# Patient Record
Sex: Male | Born: 1975 | Race: Asian | Hispanic: No | Marital: Single | Smoking: Former smoker
Health system: Southern US, Community
[De-identification: ages and names within clinical notes are randomized; demographics above are authoritative.]

## PROBLEM LIST (undated history)

## (undated) DIAGNOSIS — A159 Respiratory tuberculosis unspecified: Secondary | ICD-10-CM

---

## 2011-08-05 ENCOUNTER — Other Ambulatory Visit: Payer: Self-pay | Admitting: Infectious Diseases

## 2011-08-05 ENCOUNTER — Ambulatory Visit
Admission: RE | Admit: 2011-08-05 | Discharge: 2011-08-05 | Disposition: A | Discharge status: Home / Self Care | Payer: No Typology Code available for payment source | Source: Ambulatory Visit | Attending: Infectious Diseases | Admitting: Infectious Diseases

## 2011-08-05 DIAGNOSIS — R7611 Nonspecific reaction to tuberculin skin test without active tuberculosis: Secondary | ICD-10-CM

## 2011-12-05 ENCOUNTER — Encounter (HOSPITAL_COMMUNITY): Payer: Self-pay | Admitting: *Deleted

## 2011-12-05 ENCOUNTER — Emergency Department (HOSPITAL_COMMUNITY)
Admission: EM | Admit: 2011-12-05 | Discharge: 2011-12-05 | Disposition: A | Discharge status: Home / Self Care | Payer: BC Managed Care – PPO | Attending: Emergency Medicine | Admitting: Emergency Medicine

## 2011-12-05 DIAGNOSIS — Z79899 Other long term (current) drug therapy: Secondary | ICD-10-CM | POA: Insufficient documentation

## 2011-12-05 DIAGNOSIS — F17200 Nicotine dependence, unspecified, uncomplicated: Secondary | ICD-10-CM | POA: Insufficient documentation

## 2011-12-05 DIAGNOSIS — IMO0002 Reserved for concepts with insufficient information to code with codable children: Secondary | ICD-10-CM

## 2011-12-05 DIAGNOSIS — R55 Syncope and collapse: Secondary | ICD-10-CM

## 2011-12-05 DIAGNOSIS — R42 Dizziness and giddiness: Secondary | ICD-10-CM | POA: Insufficient documentation

## 2011-12-05 DIAGNOSIS — W1809XA Striking against other object with subsequent fall, initial encounter: Secondary | ICD-10-CM | POA: Insufficient documentation

## 2011-12-05 DIAGNOSIS — S0180XA Unspecified open wound of other part of head, initial encounter: Secondary | ICD-10-CM | POA: Insufficient documentation

## 2011-12-05 HISTORY — DX: Respiratory tuberculosis unspecified: A15.9

## 2011-12-05 MED ORDER — LIDOCAINE-EPINEPHRINE-TETRACAINE (LET) SOLUTION
3.0000 mL | Freq: Once | NASAL | Status: AC
  Administered 2011-12-05: 3 mL via TOPICAL
  Filled 2011-12-05: qty 3

## 2011-12-05 MED ORDER — TETANUS-DIPHTH-ACELL PERTUSSIS 5-2.5-18.5 LF-MCG/0.5 IM SUSP
0.5000 mL | Freq: Once | INTRAMUSCULAR | Status: AC
  Administered 2011-12-05: 0.5 mL via INTRAMUSCULAR
  Filled 2011-12-05: qty 0.5

## 2011-12-05 NOTE — ED Notes (Signed)
The pt fell earl;ier tonight coming [place].  Lac lt forehead.

## 2011-12-05 NOTE — ED Provider Notes (Signed)
History     CSN: [id number]  Arrival date & time 12/05/11  0216   First MD Initiated Contact with Patient 12/05/11 0239      Chief Complaint  Patient presents with  . Head Laceration     Patient is a 36 y.o. male presenting with scalp laceration. The history is provided by the patient. A language interpreter was used.  Head Laceration This is a new problem. The current episode started 1 to 2 hours ago. The problem occurs constantly. The problem has not changed since onset.Pertinent negatives include no chest pain, no abdominal pain, no headaches and no shortness of breath. The symptoms are aggravated by nothing. The symptoms are relieved by nothing.  pt reports he was walking to bathroom, felt dizzy/lightheaded and fell and hit his head and has laceration to his head. No LOC No HA No vomiting No neck pain No cp/sob No weakness reported He feels improved at this time He has h/o latent TB, has been managed by health department and taking meds for this He denies h/o syncope He denies ETOH use He has no other complaints  Past Medical History  Diagnosis Date  . Tuberculosis     History reviewed. No pertinent past surgical history.  History reviewed. No pertinent family history.  History  Substance Use Topics  . Smoking status: Current Everyday Smoker  . Smokeless tobacco: Not on file  . Alcohol Use: Yes      Review of Systems  Respiratory: Negative for shortness of breath.   Cardiovascular: Negative for chest pain.  Gastrointestinal: Negative for abdominal pain.  Neurological: Negative for headaches.    Allergies  Review of patient's allergies indicates no known allergies.  Home Medications   Current Outpatient Rx  Name Route Sig Dispense Refill  . RIFAMPIN 300 MG PO CAPS Oral Take 600 mg by mouth daily.      BP 139/87  Pulse 86  Temp(Src) 98.3 F (36.8 C) (Oral)  Resp 18  SpO2 98%  Physical Exam CONSTITUTIONAL: Well developed/well nourished HEAD  AND FACE: 2cm laceration to left forehead, bleeding controlled EYES: EOMI/PERRL ENMT: Mucous membranes moist NECK: supple no meningeal signs SPINE:entire spine nontender CV: S1/S2 noted, no murmurs/rubs/gallops noted LUNGS: Lungs are clear to auscultation bilaterally, no apparent distress ABDOMEN: soft, nontender, no rebound or guarding GU:no cva tenderness NEURO: Pt is awake/alert, moves all extremitiesx4, gait normal EXTREMITIES: pulses normal, full ROM SKIN: warm, color normal PSYCH: no abnormalities of mood noted  ED Course  Procedures   LACERATION REPAIR Performed by: Joya Gaskins Consent: Verbal consent obtained. Risks and benefits: risks, benefits and alternatives were discussed Patient identity confirmed: provided demographic data Time out performed prior to procedure Prepped and Draped in normal sterile fashion Wound explored  Laceration Location: left forehead  Laceration Length: 2cm  No Foreign Bodies seen or palpated  Anesthesia: local infiltration  Local anesthetic: lidocaine 1% w/ epinephrine  Anesthetic total: 3 ml  Irrigation method: syringe Amount of cleaning: standard  Skin closure: simple  Number of sutures or staples: 3 absorbable  Technique: simple interrupted  Patient tolerance: Patient tolerated the procedure well with no immediate complications.   Pt reports feeling lightheaded after standing to go to bathroom, no LOC, no headache, no vomiting, will defer imaging for now Low risk for lethal cause of  syncope Will repair laceration   MDM  Nursing notes reviewed and considered in documentation Phone interpreter used       Date: 12/05/2011  Rate: 97  Rhythm:  normal sinus rhythm  QRS Axis: normal  Intervals: normal  ST/T Wave abnormalities: normal  Conduction Disutrbances:none  Narrative Interpretation:   Old EKG Reviewed: none available    Joya Gaskins, MD 12/05/11 [phone]

## 2011-12-05 NOTE — ED Notes (Signed)
MD at the bedside  

## 2015-03-11 ENCOUNTER — Emergency Department (HOSPITAL_COMMUNITY): Payer: BLUE CROSS/BLUE SHIELD

## 2015-03-11 ENCOUNTER — Emergency Department (HOSPITAL_COMMUNITY)
Admission: EM | Admit: 2015-03-11 | Discharge: 2015-03-11 | Disposition: A | Discharge status: Home / Self Care | Payer: BLUE CROSS/BLUE SHIELD | Attending: Emergency Medicine | Admitting: Emergency Medicine

## 2015-03-11 ENCOUNTER — Encounter (HOSPITAL_COMMUNITY): Payer: Self-pay | Admitting: General Practice

## 2015-03-11 DIAGNOSIS — R0602 Shortness of breath: Secondary | ICD-10-CM | POA: Diagnosis not present

## 2015-03-11 DIAGNOSIS — Z8619 Personal history of other infectious and parasitic diseases: Secondary | ICD-10-CM | POA: Diagnosis not present

## 2015-03-11 DIAGNOSIS — Z720 Tobacco use: Secondary | ICD-10-CM | POA: Insufficient documentation

## 2015-03-11 DIAGNOSIS — K567 Ileus, unspecified: Secondary | ICD-10-CM | POA: Diagnosis not present

## 2015-03-11 DIAGNOSIS — R059 Cough, unspecified: Secondary | ICD-10-CM

## 2015-03-11 DIAGNOSIS — R509 Fever, unspecified: Secondary | ICD-10-CM | POA: Insufficient documentation

## 2015-03-11 DIAGNOSIS — R079 Chest pain, unspecified: Secondary | ICD-10-CM | POA: Diagnosis not present

## 2015-03-11 DIAGNOSIS — R05 Cough: Secondary | ICD-10-CM | POA: Insufficient documentation

## 2015-03-11 LAB — CBC
HCT: 41.4 % (ref 39.0–52.0)
Hemoglobin: 14.2 g/dL (ref 13.0–17.0)
MCH: 32.1 pg (ref 26.0–34.0)
MCHC: 34.3 g/dL (ref 30.0–36.0)
MCV: 93.7 fL (ref 78.0–100.0)
Platelets: 131 10*3/uL — ABNORMAL LOW (ref 150–400)
RBC: 4.42 MIL/uL (ref 4.22–5.81)
RDW: 12.5 % (ref 11.5–15.5)
WBC: 9.8 10*3/uL (ref 4.0–10.5)

## 2015-03-11 LAB — COMPREHENSIVE METABOLIC PANEL
ALBUMIN: 3.7 g/dL (ref 3.5–5.0)
ALK PHOS: 43 U/L (ref 38–126)
ALT: 16 U/L — AB (ref 17–63)
AST: 27 U/L (ref 15–41)
Anion gap: 9 (ref 5–15)
BUN: 8 mg/dL (ref 6–20)
CALCIUM: 9.2 mg/dL (ref 8.9–10.3)
CO2: 24 mmol/L (ref 22–32)
CREATININE: 0.66 mg/dL (ref 0.61–1.24)
Chloride: 103 mmol/L (ref 101–111)
GFR calc Af Amer: >60 mL/min (ref >60)
Glucose, Bld: 98 mg/dL (ref 65–99)
POTASSIUM: 3.7 mmol/L (ref 3.5–5.1)
SODIUM: 136 mmol/L (ref 135–145)
Total Bilirubin: 0.9 mg/dL (ref 0.3–1.2)
Total Protein: 7.6 g/dL (ref 6.5–8.1)

## 2015-03-11 LAB — I-STAT TROPONIN, ED: Troponin i, poc: 0 ng/mL (ref 0.00–0.08)

## 2015-03-11 LAB — D-DIMER, QUANTITATIVE: D-Dimer, Quant: 1.15 ug/mL-FEU — ABNORMAL HIGH (ref 0.00–0.48)

## 2015-03-11 LAB — LIPASE, BLOOD: Lipase: 24 U/L (ref 22–51)

## 2015-03-11 MED ORDER — DEXTROMETHORPHAN HBR 15 MG/5ML PO SYRP: 10.0000 mL | ORAL_SOLUTION | Freq: Four times a day (QID) | ORAL | Status: AC | PRN

## 2015-03-11 MED ORDER — TRAMADOL HCL 50 MG PO TABS: 50.0000 mg | ORAL_TABLET | Freq: Four times a day (QID) | ORAL | Status: AC | PRN

## 2015-03-11 MED ORDER — IOHEXOL 350 MG/ML SOLN
80.0000 mL | Freq: Once | INTRAVENOUS | Status: AC | PRN
  Administered 2015-03-11: 60 mL via INTRAVENOUS

## 2015-03-11 NOTE — ED Provider Notes (Signed)
CSN: [id number]     Arrival date & time 03/11/15  1057 History   First MD Initiated Contact with Patient 03/11/15 1102     Chief Complaint  Patient presents with  . Cough     (Consider location/radiation/quality/duration/timing/severity/associated sxs/prior Treatment) HPI Comments: 39 year old male presenting with cough 3 days. Cough is nonproductive but he feels as if he needs to cough up mucus. Admits to associated shortness of breath and chest pain with coughing only along with epigastric abdominal pain with coughing only. Reports having a fever 3 days ago which has since subsided. No alleviating factors tried. Has a decreased appetite. No weight loss. Denies nausea, vomiting, diarrhea. No recent travel.  Patient is a 39 y.o. male presenting with cough. The history is provided by the patient. The history is limited by a language barrier. A language interpreter was used.  Cough Associated symptoms: chest pain (with coughing only), fever and shortness of breath     Past Medical History  Diagnosis Date  . Tuberculosis    History reviewed. No pertinent past surgical history. No family history on file. History  Substance Use Topics  . Smoking status: Current Every Day Smoker -- 0.25 packs/day    Types: Cigarettes  . Smokeless tobacco: Not on file  . Alcohol Use: Yes    Review of Systems  Constitutional: Positive for fever.  Respiratory: Positive for cough and shortness of breath.   Cardiovascular: Positive for chest pain (with coughing only).  Gastrointestinal: Positive for abdominal pain (with coughing).  All other systems reviewed and are negative.     Allergies  Review of patient's allergies indicates no known allergies.  Home Medications   Prior to Admission medications   Medication Sig Start Date End Date Taking? Authorizing Provider  dextromethorphan 15 MG/5ML syrup Take 10 mLs (30 mg total) by mouth 4 (four) times daily as needed for cough. 03/11/15   Kathrynn Speed,  PA-C  traMADol (ULTRAM) 50 MG tablet Take 1 tablet (50 mg total) by mouth every 6 (six) hours as needed. 03/11/15   Jos Cygan M Layn Kye, PA-C   BP 129/77 mmHg  Pulse 88  Temp(Src) 97.5 F (36.4 C) (Oral)  Resp 17  Ht  (1.575 m)  Wt 120 lb (54.432 kg)  BMI 21.94 kg/m2  SpO2 98% Physical Exam  Constitutional: He is oriented to person, place, and time. He appears well-developed and well-nourished. No distress.  HENT:  Head: Normocephalic and atraumatic.  Mouth/Throat: Oropharynx is clear and moist.  Eyes: Conjunctivae and EOM are normal. Pupils are equal, round, and reactive to light.  Neck: Normal range of motion. Neck supple. No JVD present.  Cardiovascular: Normal rate, regular rhythm, normal heart sounds and intact distal pulses.   No extremity edema.  Pulmonary/Chest: Effort normal and breath sounds normal. No respiratory distress.  Dry cough present.  Abdominal: Normal appearance and bowel sounds are normal. He exhibits no distension. There is tenderness in the epigastric area. There is no rigidity, no rebound and no guarding.  No peritoneal signs.  Musculoskeletal: Normal range of motion. He exhibits no edema.  Neurological: He is alert and oriented to person, place, and time. He has normal strength. No sensory deficit.  Speech fluent, goal oriented. Moves extremities without ataxia. Equal grip strength bilateral.  Skin: Skin is warm and dry. He is not diaphoretic.  Psychiatric: He has a normal mood and affect. His behavior is normal.  Nursing note and vitals reviewed.   ED Course  Procedures (including critical  care time) Labs Review Labs Reviewed  CBC - Abnormal; Notable for the following:    Platelets 131 (*)    All other components within normal limits  COMPREHENSIVE METABOLIC PANEL - Abnormal; Notable for the following:    ALT 16 (*)    All other components within normal limits  D-DIMER, QUANTITATIVE (NOT AT [facility]) - Abnormal; Notable for the following:    D-Dimer,  Quant 1.15 (*)    All other components within normal limits  LIPASE, BLOOD  I-STAT TROPOININ, ED    Imaging Review Dg Chest 2 View  03/11/2015   CLINICAL DATA:  Cough.  EXAM: CHEST  2 VIEW  COMPARISON:  None.  FINDINGS: Mediastinum hilar structures normal. Lungs are clear. Heart size normal. No pleural effusion or pneumothorax. Dilated loops of small large bowel are noted. This is most consistent adynamic ileus.  IMPRESSION: 1.  No acute cardiopulmonary disease.  2.  Adynamic ileus.   Electronically Signed   By: Maisie Fus  Register   On: 03/11/2015 12:35   Ct Angio Chest Pe W/cm &/or Wo Cm  03/11/2015   CLINICAL DATA:  Cough  EXAM: CT ANGIOGRAPHY CHEST WITH CONTRAST  TECHNIQUE: Multidetector CT imaging of the chest was performed using the standard protocol during bolus administration of intravenous contrast. Multiplanar CT image reconstructions and MIPs were obtained to evaluate the vascular anatomy.  CONTRAST:  60mL OMNIPAQUE IOHEXOL 350 MG/ML SOLN  COMPARISON:  Chest x-ray 03/11/2015  FINDINGS: Negative for pulmonary embolism.  Pulmonary arteries normal in size.  Negative for aortic aneurysm or dissection. Mild cardiac enlargement. Negative for pericardial effusion. Negative for coronary calcification.  Lungs are clear without infiltrate or effusion.  Negative for mass or adenopathy.  No pleural effusion.  Negative upper abdomen.  No acute skeletal lesion.  Review of the MIP images confirms the above findings.  IMPRESSION: Negative   Electronically Signed   By: Marlan Palau M.D.   On: 03/11/2015 16:10     EKG Interpretation   Date/Time:  Tuesday March 11 2015 11:19:27 EDT Ventricular Rate:  93 PR Interval:  139 QRS Duration: 88 QT Interval:  341 QTC Calculation: 424 R Axis:   31 Text Interpretation:  Sinus rhythm No prior for comparison Confirmed by  Gwendolyn Grant  MD, BLAIR ([id number]) on 03/11/2015 11:43:45 AM      MDM   Final diagnoses:  Cough  Shortness of breath  Ileus   Non-toxic  appearing, NAD. AFVSS. O2 sat 100% on RA. Pain with coughing only. Initial thought of CAP with reported fever, CXR negative for any acute cardiopulmonary finding. Adynamic ileus noted. Abdomen is soft with mild epigastric pain, no peritoneal signs. No vomiting. Normal BM. Given tachy on arrival with cough and SOB, d-dimer obtained and elevated at 1.15. CT angio pending. Plan d/c home if normal.  CT angina negative. Patient in no apparent distress. Vitals remain stable. No further tachycardia. Abdomen soft with minimal tenderness. Will discharge home with dextromethorphan and tramadol. Resources given for PCP follow-up. Stable for discharge. Return precautions given. Patient states understanding of treatment care plan and is agreeable.  Kathrynn Speed, PA-C 03/11/15 1623  Elwin Mocha, MD 03/11/15 [phone]

## 2015-03-11 NOTE — ED Notes (Signed)
Interpreter phone used to go over discharge paperwork.

## 2015-03-11 NOTE — ED Notes (Signed)
Pt complaining of abdominal pain and chest pain when coughing from 2-3 days. Pt reporting a nonproductive cough. Pt denies N/V/D. Pt describes abdominal pain as burning. Pt also reporting a loss of appetite. Pt denies fever or chills.

## 2015-03-11 NOTE — ED Notes (Signed)
Per verbal orders Celene Skeen, PA.. EKG completed

## 2015-03-11 NOTE — Discharge Instructions (Signed)
Take dextromethorphan as directed as needed for cough. Take tramadol as directed as needed for pain. Follow up with the wellness clinic or one of the resources below to establish care with a primary care physician.  Cough, Adult  A cough is a reflex that helps clear your throat and airways. It can help heal the body or may be a reaction to an irritated airway. A cough may only last 2 or 3 weeks (acute) or may last more than 8 weeks (chronic).  CAUSES Acute cough:  Viral or bacterial infections. Chronic cough:  Infections.  Allergies.  Asthma.  Post-nasal drip.  Smoking.  Heartburn or acid reflux.  Some medicines.  Chronic lung problems (COPD).  Cancer. SYMPTOMS   Cough.  Fever.  Chest pain.  Increased breathing rate.  High-pitched whistling sound when breathing (wheezing).  Colored mucus that you cough up (sputum). TREATMENT   A bacterial cough may be treated with antibiotic medicine.  A viral cough must run its course and will not respond to antibiotics.  Your caregiver may recommend other treatments if you have a chronic cough. HOME CARE INSTRUCTIONS   Only take over-the-counter or prescription medicines for pain, discomfort, or fever as directed by your caregiver. Use cough suppressants only as directed by your caregiver.  Use a cold steam vaporizer or humidifier in your bedroom or home to help loosen secretions.  Sleep in a semi-upright position if your cough is worse at night.  Rest as needed.  Stop smoking if you smoke. SEEK IMMEDIATE MEDICAL CARE IF:   You have pus in your sputum.  Your cough starts to worsen.  You cannot control your cough with suppressants and are losing sleep.  You begin coughing up blood.  You have difficulty breathing.  You develop pain which is getting worse or is uncontrolled with medicine.  You have a fever. MAKE SURE YOU:   Understand these instructions.  Will watch your condition.  Will get help right  away if you are not doing well or get worse. Document Released: 01/22/2011 Document Revised: 10/18/2011 Document Reviewed: 01/22/2011 [facility] Patient Information 2015 [place], [place]. This information is not intended to replace advice given to you by your health care provider. Make sure you discuss any questions you have with your health care provider.  Ileus The intestine (bowel, or gut) is a long, muscular tube connecting your stomach to your rectum. If the intestine stops working, food cannot pass through. This is called an ileus. This can happen for a variety of reasons. Ileus is a major medical problem that usually requires hospitalization. If your intestine stops working because of a blockage, this is called a bowel obstruction and is a different condition. CAUSES   Surgery in your abdomen. This can last from a few hours to a few days.  An infection or inflammation in the belly (abdomen). This includes inflammation of the lining of the abdomen (peritonitis).  Infection or inflammation in other parts of the body, such as pneumonia or pancreatitis.  Passage of gallstones or kidney stones.  Damage to the nerves or blood vessels which go to the bowel.  Imbalance in the salts in the blood (electrolytes).  Injury to the brain and/or spinal cord.  Medications. Many medications can cause ileus or make it worse. The most common of these are strong pain medications. SYMPTOMS  Symptoms of bowel obstruction come from the bowel inactivity. They may include:  Bloating. Your belly gets bigger (distension).  Pain or discomfort in the abdomen.  Poor appetite, feeling sick to your stomach (nausea), and vomiting.  You may also not be able to hear your normal bowel sounds, such as "growling" in your stomach. DIAGNOSIS   Your history and a physical exam will usually suggest to your caregiver that you have an ileus.  X-rays or a CT scan of your abdomen will confirm the diagnosis. X-rays, CT  scans, and lab tests may also suggest the cause. TREATMENT   Rest the intestine until it starts working again. This is most often accomplished by:  Stopping intake of oral food and drink. Dehydration is prevented by using IV (intravenous) fluids.  Sometimes, a nasogastric tube (NG tube) is needed. This is a narrow plastic tube inserted through your nose and into your stomach. It is connected to suction to keep the stomach emptied out. This also helps treat the nausea and vomiting.  If there is an imbalance in the electrolytes, they are corrected with supplements in your intravenous fluids.  Medications that might make an ileus worse might be stopped.  There are no medications that reliably treat ileus, though your caregiver may suggest a trial of certain medications.  If your condition is slow to resolve, you will be reevaluated to be sure another condition, such as a blockage, is not present. Ileus is common and usually has a good outcome. Depending on the cause of your ileus, it usually can be treated by your caregivers with good results. Sometimes, specialists (surgeons or gastroenterologists) are asked to assist in your care.  HOME CARE INSTRUCTIONS   Follow your caregiver's instructions regarding diet and fluid intake. This will usually include drinking plenty of clear fluids, avoiding alcohol and caffeine, and eating a gentle diet.  Follow your caregiver's instructions regarding activity. A period of rest is sometimes advised before returning to work or school.  Take only medications prescribed by your caregiver. Be especially careful with narcotic pain medication, which can slow your bowel activity and contribute to ileus.  Keep any follow-up appointments with your caregiver or specialists. SEEK MEDICAL CARE IF:   You have a recurrence of nausea, vomiting, or abdominal discomfort.  You develop fever of more than 102 F (38.9 C). SEEK IMMEDIATE MEDICAL CARE IF:   You have  severe abdominal pain.  You are unable to keep fluids down. Document Released: 07/29/2003 Document Revised: 12/10/2013 Document Reviewed: 11/28/2008 [facility] Patient Information 2015 [place], [place]. This information is not intended to replace advice given to you by your health care provider. Make sure you discuss any questions you have with your health care provider. RESOURCE GUIDE  Chronic Pain Problems: Contact [facility]  [phone] Patients need to be referred by their primary care doctor.  Insufficient Money for Medicine: Contact [employer]:  call "211."   No Primary Care Doctor: - Call [facility]  [phone] - can help you locate a primary care doctor that  accepts your insurance, provides certain services, etc. - Physician Referral [phone]  Agencies that provide inexpensive medical care: - [facility]  [phone] - [facility]  [phone] - [facility]  [phone] - [facility]  [phone] - [facility]  [phone] [facility]  [phone]  Medicaid-accepting [facility] Providers: - [facility]- [address]  [phone], Mon-Fri 9am-7pm, Sat 9am-1pm - [facility]- [address] [place], [address] [place]  [phone] - [facility]- [address]  [phone] [facility]- [address]  [phone] - Renaye Rakers- [address] [place], [address], [phone]  Only accepts [place] patients after they have their name  applied to their card  Self Pay (no insurance) in [facility]: - Sickle Cell Patients: Dr Willey Blade, [facility]  [address] [place], [phone] - [facility]- [address] [place]  [phone]       [facility] [place] [facility]- [address]       -     [facility]- see information above (Speak  to [employer] if you do not have insurance)       -  [facility]- [address],  [phone]       -  [facility]- [address], [phone]       -  Dr Julio Sicks-  [address], [place], [phone]       -  [facility] - [address], [phone]       -  [facility]- [address] [phone], also [address] [phone]       -    [facility]- [address] [place], [phone], 1st & 3rd Saturday        every month, 10am-1pm  1) Find a Doctor and Pay Out of Pocket Although you won't have to find out who is covered by your insurance plan, it is a good idea to ask around and get recommendations. You will then need to call the office and see if the doctor you have chosen will accept you as a new patient and what types of options they offer for patients who are self-pay. Some doctors offer discounts or will set up payment plans for their patients who do not have insurance, but you will need to ask so you aren't surprised when you get to your appointment.  2) Contact Your Local Health Department Not all health departments have doctors that can see patients for sick visits, but many do, so it is worth a call to see if yours does. If you don't know where your local health department is, you can check in your phone book. The CDC also has a tool to help you locate your state's health department, and many state websites also have listings of all of their local health departments.  3) Find a Walk-in Clinic If your illness is not likely to be very severe or complicated, you may want to try a walk in clinic. These are popping up all over the country in pharmacies, drugstores, and shopping centers. They're usually staffed by nurse practitioners or physician assistants that have been trained to treat common illnesses and complaints. They're usually fairly quick and inexpensive. However, if you have serious medical  issues or chronic medical problems, these are probably not your best option

## 2015-10-26 ENCOUNTER — Encounter (HOSPITAL_COMMUNITY): Payer: Self-pay | Admitting: Emergency Medicine

## 2015-10-26 ENCOUNTER — Emergency Department (HOSPITAL_COMMUNITY)
Admission: EM | Admit: 2015-10-26 | Discharge: 2015-10-26 | Disposition: A | Discharge status: Home / Self Care | Payer: BLUE CROSS/BLUE SHIELD | Attending: Emergency Medicine | Admitting: Emergency Medicine

## 2015-10-26 DIAGNOSIS — J3489 Other specified disorders of nose and nasal sinuses: Secondary | ICD-10-CM

## 2015-10-26 DIAGNOSIS — R0981 Nasal congestion: Secondary | ICD-10-CM | POA: Diagnosis present

## 2015-10-26 DIAGNOSIS — Z87891 Personal history of nicotine dependence: Secondary | ICD-10-CM | POA: Insufficient documentation

## 2015-10-26 DIAGNOSIS — Z8611 Personal history of tuberculosis: Secondary | ICD-10-CM | POA: Diagnosis not present

## 2015-10-26 MED ORDER — FLUTICASONE PROPIONATE 50 MCG/ACT NA SUSP: 2.0000 | Freq: Every day | NASAL | Status: AC

## 2015-10-26 NOTE — ED Notes (Signed)
C/o runny nose today.  Denies any other symptoms.

## 2015-10-26 NOTE — Discharge Instructions (Signed)
You may have allergies.  Follow up with a physician.

## 2015-10-26 NOTE — ED Notes (Signed)
See EDP assessment 

## 2015-10-26 NOTE — ED Provider Notes (Signed)
CSN: [id number]     Arrival date & time 10/26/15  2015 History  By signing my name below, I, Doreatha Martin, attest that this documentation has been prepared under the direction and in the presence of  [employer], PA-C. Electronically Signed: Doreatha Martin, ED Scribe. 10/26/2015. 9:34 PM.    Chief Complaint  Patient presents with  . Nasal Congestion   The history is provided by the patient and the spouse. No language interpreter was used.   HPI Comments: Evan Reeves is a 40 y.o. male who presents to the Emergency Department complaining of mild rhinorrhea onset today. Pt denies taking OTC medications at home to improve symptoms. No h/o seasonal allergies. No known environmental or food allergies. He denies fever, chills, cough.    Past Medical History  Diagnosis Date  . Tuberculosis    History reviewed. No pertinent past surgical history. No family history on file. Social History  Substance Use Topics  . Smoking status: Former Smoker -- 0.25 packs/day  . Smokeless tobacco: None  . Alcohol Use: No    Review of Systems  Constitutional: Negative for fever and chills.  HENT: Positive for rhinorrhea.   Respiratory: Negative for cough.    Allergies  Review of patient's allergies indicates no known allergies.  Home Medications   Prior to Admission medications   Medication Sig Start Date End Date Taking? Authorizing Provider  dextromethorphan 15 MG/5ML syrup Take 10 mLs (30 mg total) by mouth 4 (four) times daily as needed for cough. 03/11/15   Kathrynn Speed, PA-C  fluticasone (FLONASE) 50 MCG/ACT nasal spray Place 2 sprays into both nostrils daily. 10/26/15   Lakera Viall Patel-Mills, PA-C  traMADol (ULTRAM) 50 MG tablet Take 1 tablet (50 mg total) by mouth every 6 (six) hours as needed. 03/11/15   Robyn M Hess, PA-C   BP 146/98 mmHg  Pulse 131  Temp(Src) 98.8 F (37.1 C) (Oral)  Resp 18  SpO2 97% Physical Exam  Constitutional: He is oriented to person, place, and time. He appears  well-developed and well-nourished. No distress.  HENT:  Head: Normocephalic and atraumatic.  Eyes: Conjunctivae and EOM are normal. Pupils are equal, round, and reactive to light.  Neck: Normal range of motion. Neck supple.  Cardiovascular: Normal rate.   Pulmonary/Chest: Effort normal. No respiratory distress.  Musculoskeletal: Normal range of motion.  Neurological: He is alert and oriented to person, place, and time.  Skin: Skin is warm and dry.  Psychiatric: He has a normal mood and affect. His behavior is normal.  Nursing note and vitals reviewed.   ED Course  Procedures (including critical care time) DIAGNOSTIC STUDIES: Oxygen Saturation is 97% on RA, normal by my interpretation.    COORDINATION OF CARE: 9:33 PM Discussed treatment plan with pt at bedside which includes symptomatic therapy and pt agreed to plan.    MDM   Final diagnoses:  Rhinorrhea   Meds given in ED:  Medications - No data to display  Discharge Medication List as of 10/26/2015  9:39 PM    START taking these medications   Details  fluticasone (FLONASE) 50 MCG/ACT nasal spray Place 2 sprays into both nostrils daily., Starting 10/26/2015, Until Discontinued, Print        BP 146/98 mmHg  Pulse 131  Temp(Src) 98.8 F (37.1 C) (Oral)  Resp 18  SpO2 97%   Pt presents with rhinorrhea for one day. No known environmental or food allergies. Pt will be discharged with Rx for flonase. Discussed return  precautions. Patient asked for 2 days off from work.  I explained that I could not do that for rhinorrhea without any other symptoms.  He was given a work note that stated he was here at 9pm today and could return tomorrow. Pt is hemodynamically stable & in NAD prior to discharge. Pt is agreeable to plan.   I personally performed the services described in this documentation, which was scribed in my presence. The recorded information has been reviewed and is accurate.   Catha GosselinHanna Patel-Mills, PA-C 10/26/15  [id number]  Linwood DibblesJon Knapp, MD 10/26/15 2250

## 2016-05-22 IMAGING — CT CT ANGIO CHEST
2 of 8 series · 19 of 46 positions shown · IV contrast (OMNI)
Comparison: Chest x-ray 03/11/2015

CLINICAL DATA: Cough

EXAM:
CT ANGIOGRAPHY CHEST WITH CONTRAST
TECHNIQUE: Multidetector CT imaging of the chest was performed using the
standard protocol during bolus administration of intravenous
contrast. Multiplanar CT image reconstructions and MIPs were
obtained to evaluate the vascular anatomy.
CONTRAST:  60mL OMNIPAQUE IOHEXOL 350 MG/ML SOLN

[Series 5: thins · axial · 0.60mm/px · z∈[+1240,+1503]mm · 16 of 291 slices shown]
[im 14/291  lung]
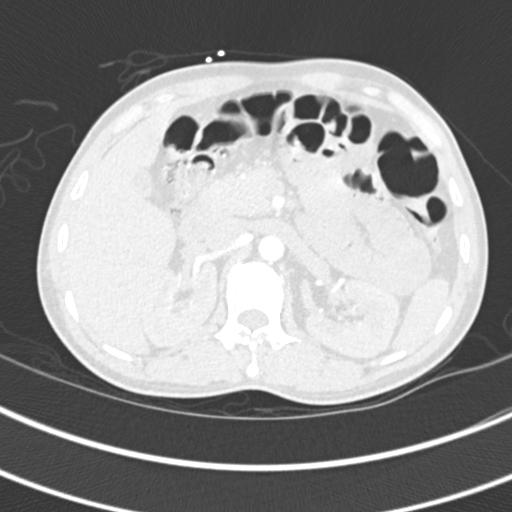
[im 27/291  soft-tissue]
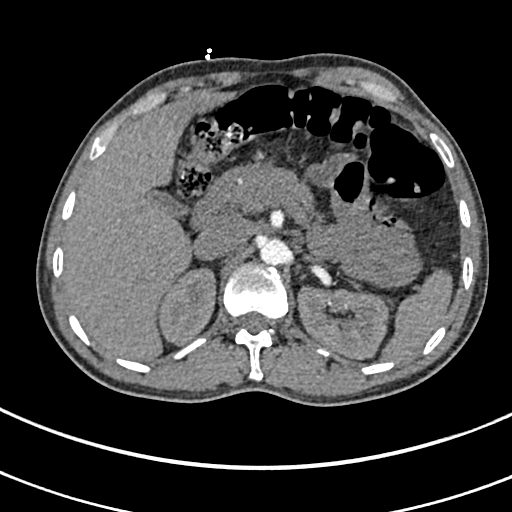
[im 53/291  lung]
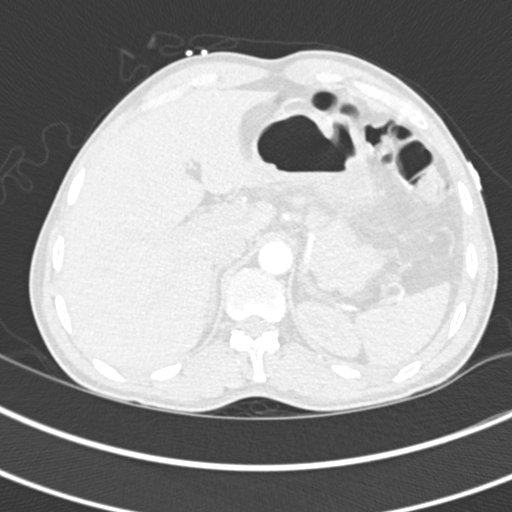
[im 66/291  soft-tissue]
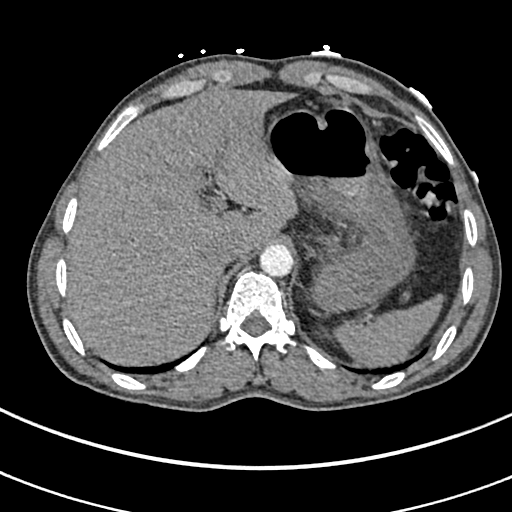
[im 80/291  lung]
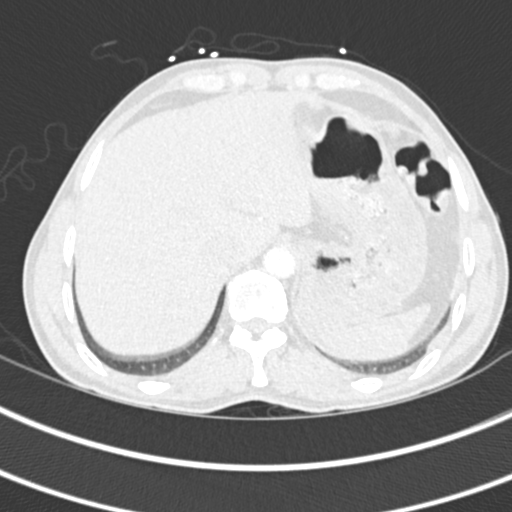
[im 106/291  soft-tissue]
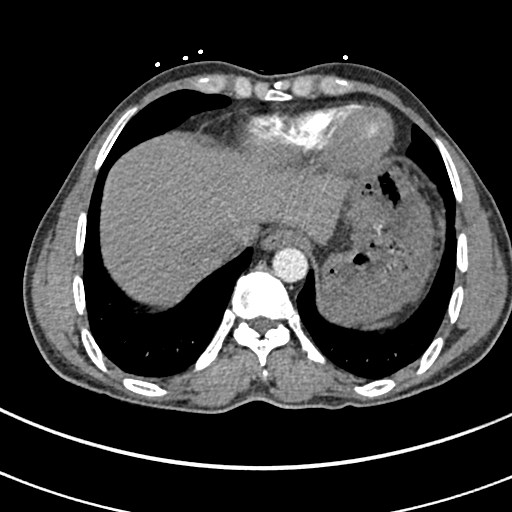
[im 119/291  lung]
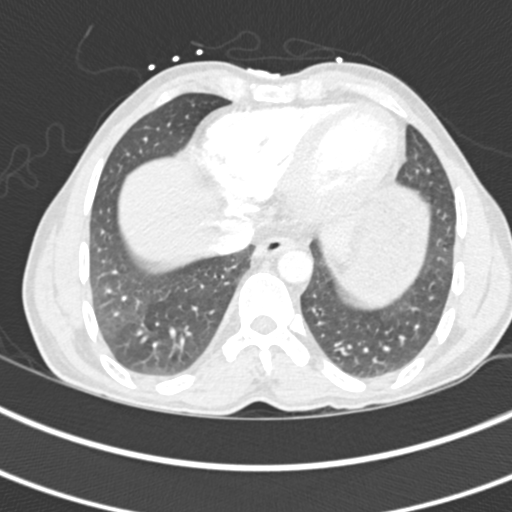
[im 132/291  soft-tissue]
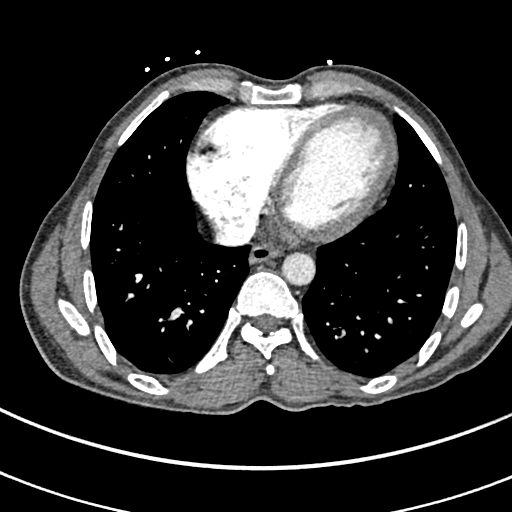
[im 159/291  lung]
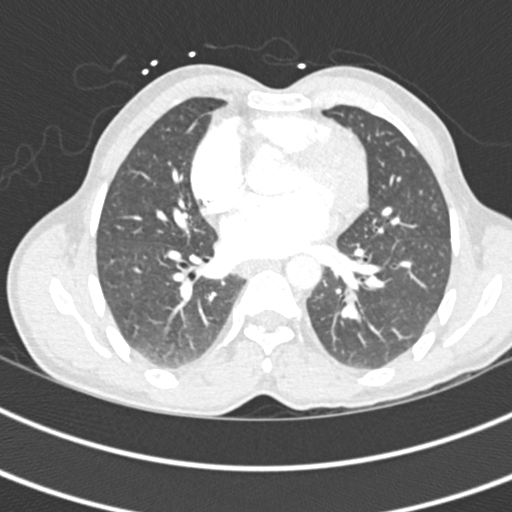
[im 172/291  soft-tissue]
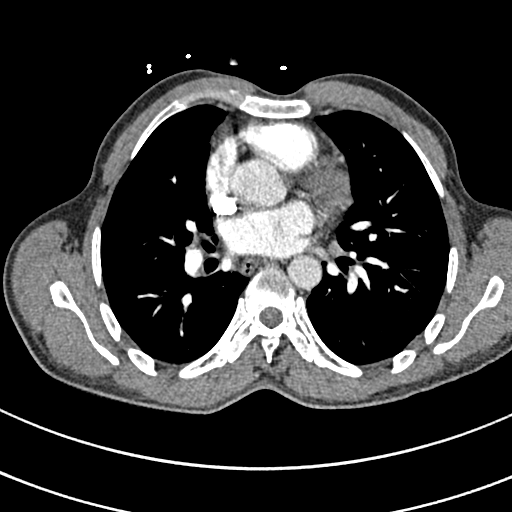
[im 185/291  lung]
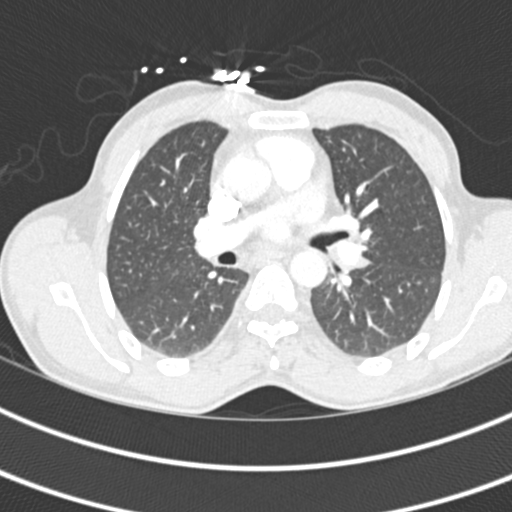
[im 211/291  soft-tissue]
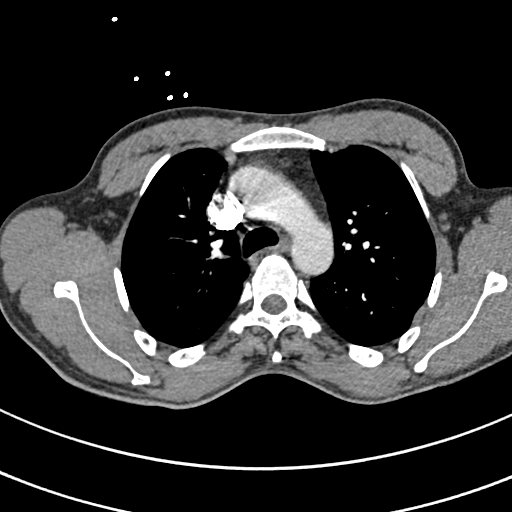
[im 225/291  lung]
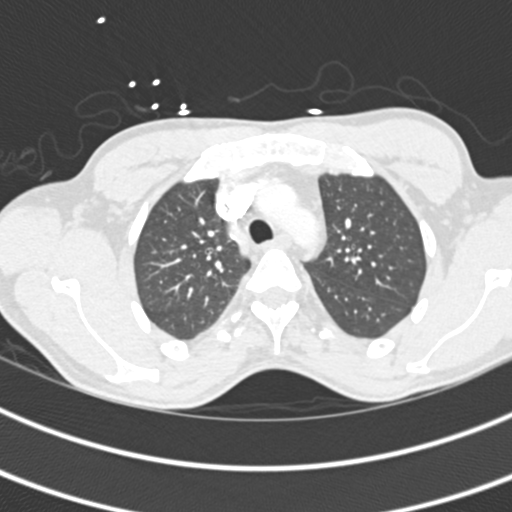
[im 238/291  soft-tissue]
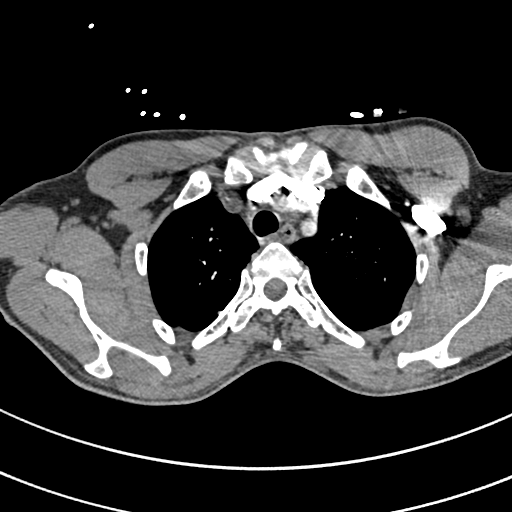
[im 264/291  lung]
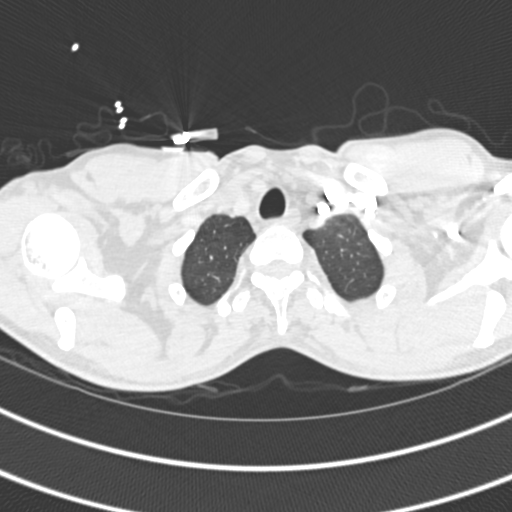
[im 277/291  soft-tissue]
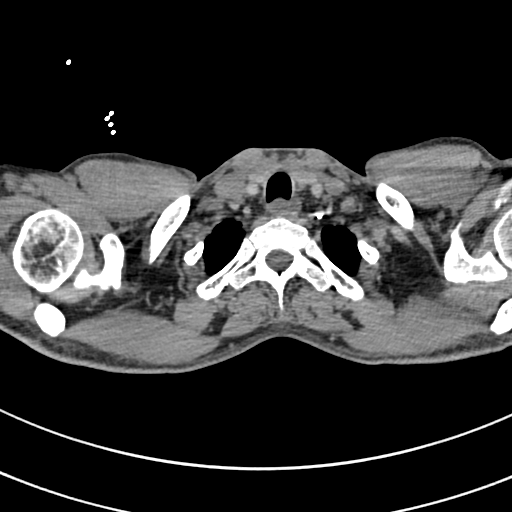

[Series 7: coronal mpr · coronal · 0.59mm/px · 3 of 109 slices shown]
[im 28/109  soft-tissue]
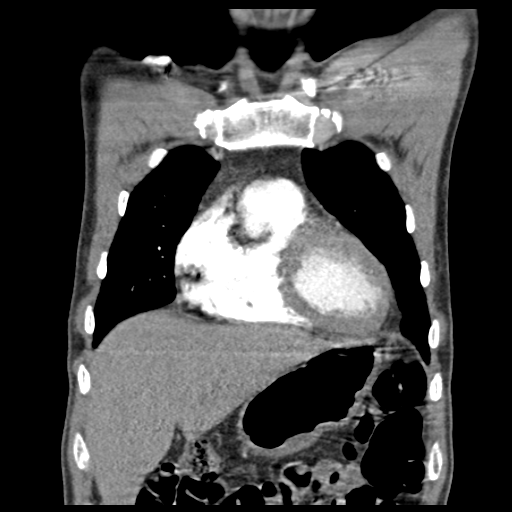
[im 55/109  soft-tissue]
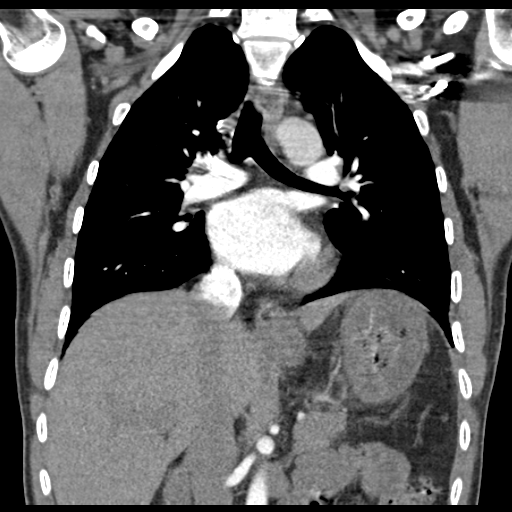
[im 82/109  soft-tissue]
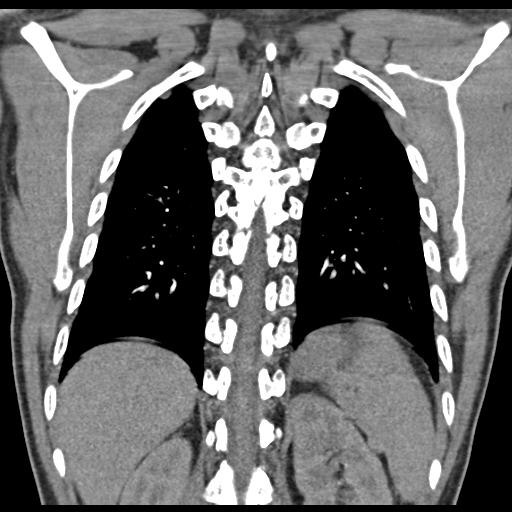

[19 of 46 positions shown; findings below may reference images not displayed]

FINDINGS: Negative for pulmonary embolism.  Pulmonary arteries normal in size.

Negative for aortic aneurysm or dissection. Mild cardiac
enlargement. Negative for pericardial effusion. Negative for
coronary calcification.

Lungs are clear without infiltrate or effusion.

Negative for mass or adenopathy.  No pleural effusion.

Negative upper abdomen.  No acute skeletal lesion.

Review of the MIP images confirms the above findings.
IMPRESSION: Negative

## 2016-05-22 IMAGING — CR DG CHEST 2V
2 series · 2 of 2 positions shown · non-contrast
Comparison: None.

CLINICAL DATA: Cough.

EXAM:
CHEST  2 VIEW

[chest pa]
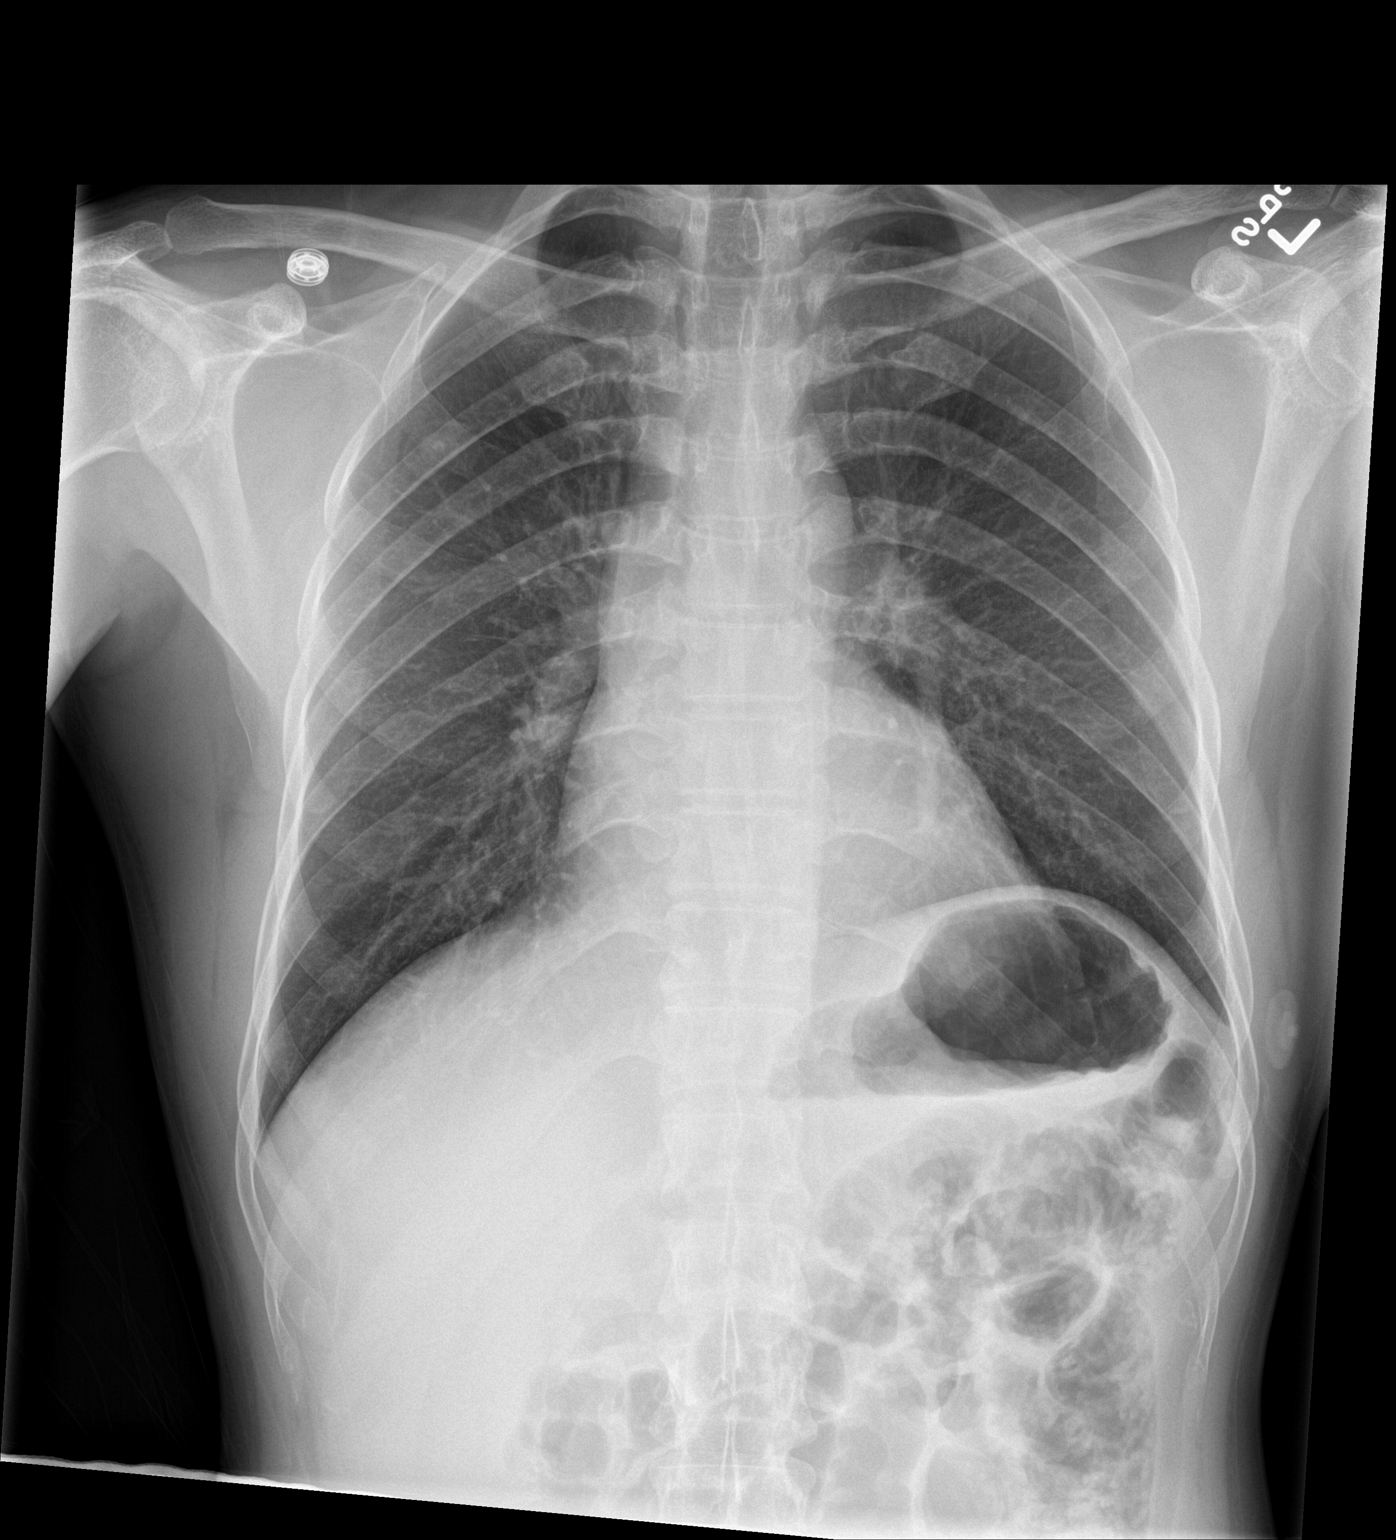

[chest lat]
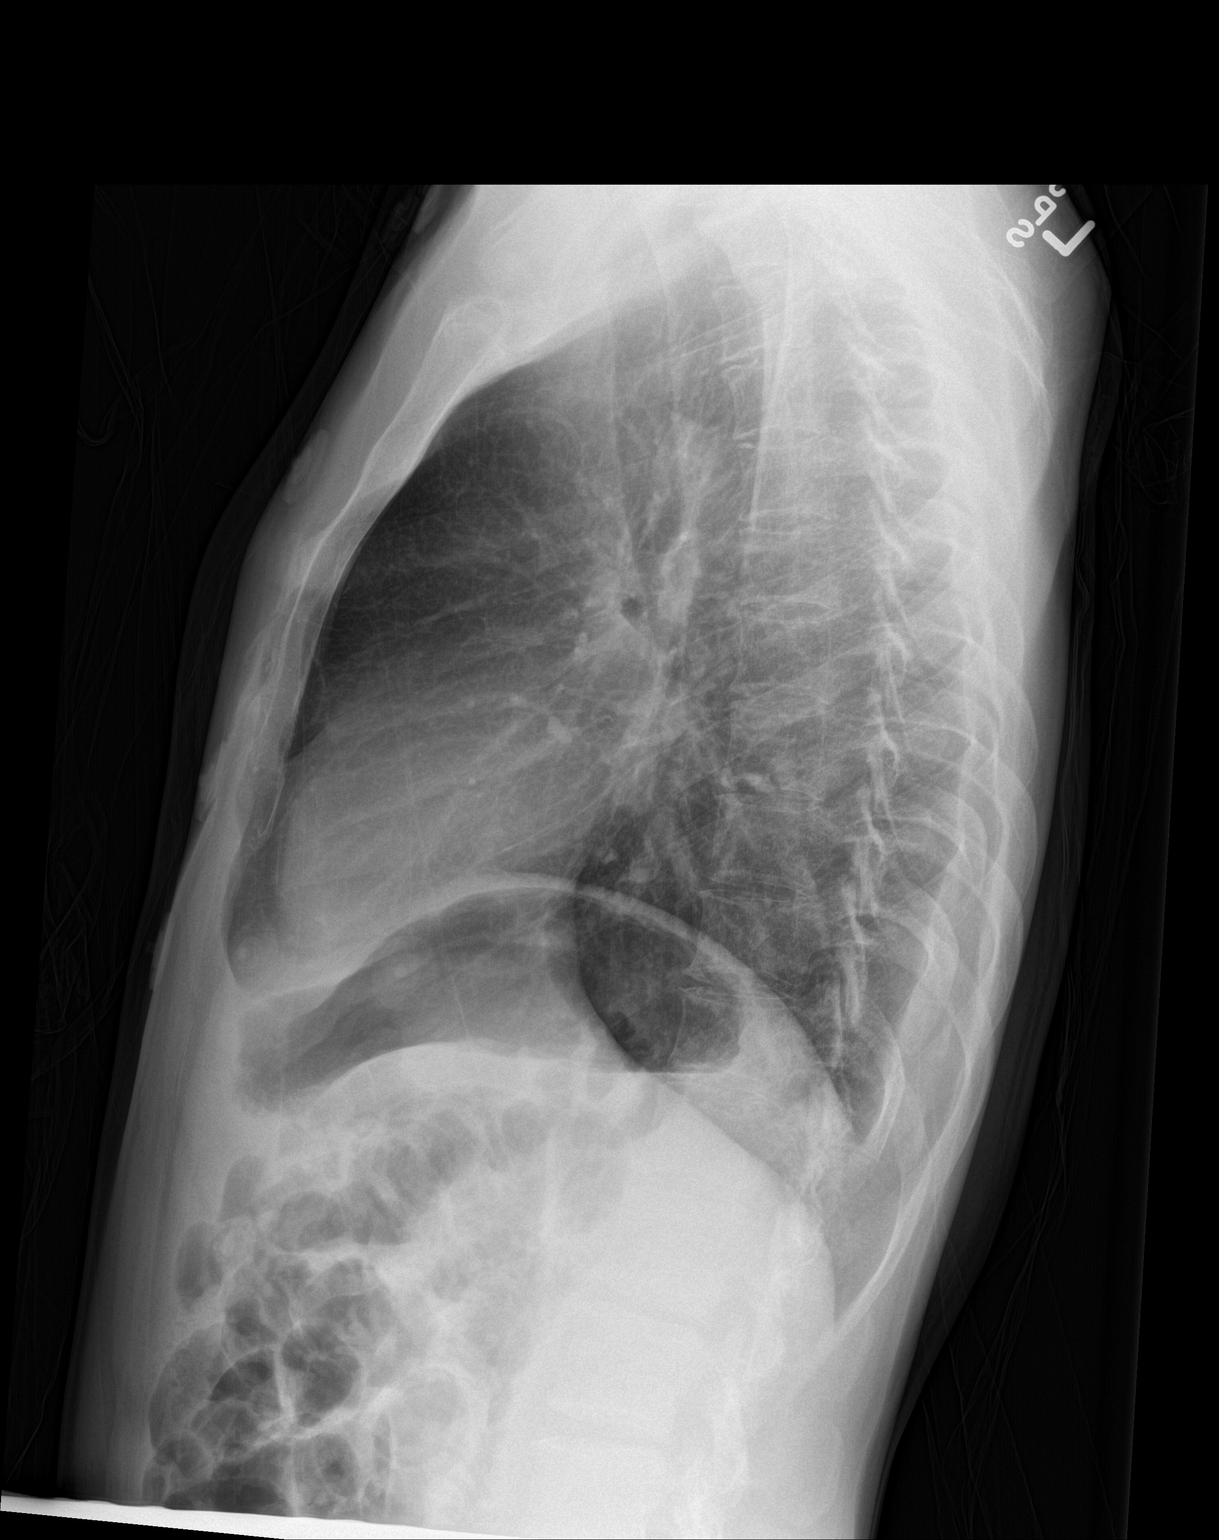

[2 of 2 positions shown; findings below may reference images not displayed]

FINDINGS: Mediastinum hilar structures normal. Lungs are clear. Heart size
normal. No pleural effusion or pneumothorax. Dilated loops of small
large bowel are noted. This is most consistent adynamic ileus.
IMPRESSION: 1.  No acute cardiopulmonary disease.

2.  Adynamic ileus.
